# Patient Record
Sex: Male | Born: 1965 | Hispanic: No | Marital: Married | Smoking: Current some day smoker
Health system: Southern US, Community
[De-identification: ages and names within clinical notes are randomized; demographics above are authoritative.]

---

## 2001-04-11 ENCOUNTER — Emergency Department (HOSPITAL_COMMUNITY): Admission: EM | Admit: 2001-04-11 | Discharge: 2001-04-11 | Payer: Self-pay | Admitting: Emergency Medicine

## 2012-06-17 ENCOUNTER — Ambulatory Visit (INDEPENDENT_AMBULATORY_CARE_PROVIDER_SITE_OTHER): Payer: BC Managed Care – PPO | Admitting: Emergency Medicine

## 2012-06-17 VITALS — BP 132/82 | HR 68 | Temp 98.6°F | Resp 17 | Ht 65.5 in | Wt 110.0 lb

## 2012-06-17 DIAGNOSIS — IMO0002 Reserved for concepts with insufficient information to code with codable children: Secondary | ICD-10-CM

## 2012-06-17 DIAGNOSIS — S46919A Strain of unspecified muscle, fascia and tendon at shoulder and upper arm level, unspecified arm, initial encounter: Secondary | ICD-10-CM

## 2012-06-17 MED ORDER — CELECOXIB 200 MG PO CAPS: 200.0000 mg | ORAL_CAPSULE | Freq: Two times a day (BID) | ORAL | Status: AC

## 2012-06-17 MED ORDER — CYCLOBENZAPRINE HCL 10 MG PO TABS: 10.0000 mg | ORAL_TABLET | Freq: Three times a day (TID) | ORAL | Status: AC | PRN

## 2012-06-17 NOTE — Progress Notes (Signed)
   Date:  06/17/2012   Name:  Antonio Stewart   DOB:  [birth year]   MRN:  [id number]  PCP:  No primary provider on file.    Chief Complaint: Shoulder Pain   History of Present Illness:  Antonio Stewart is a 46 y.o. very pleasant male patient who presents with the following:  Pain in right shoulder after starting his pull start lawnmower.  Has marked pain in top of shoulder.  Not involving the joint but in front of scapula.  No numbness tingling or weakness in arm.  Denies other injury or prior injury to shoulder  There is no problem list on file for this patient.  No past medical history on file. No past surgical history on file. History  Substance Use Topics  . Smoking status: Current Everyday Smoker -- 0.5 packs/day for 20 years    Types: Cigarettes  . Smokeless tobacco: Not on file  . Alcohol Use: Not on file   No family history on file. No Known Allergies  Medication list has been reviewed and updated.  No current outpatient prescriptions on file prior to visit.    Review of Systems:  As per HPI, otherwise negative.     Physical Examination: Filed Vitals:   06/17/12 1357  BP: 132/82  Pulse: 68  Temp: 98.6 F (37 C)  Resp: 17   Filed Vitals:   06/17/12 1357  Height: 5' 5.5" (1.664 m)  Weight: 110 lb (49.896 kg)   Body mass index is 18.03 kg/(m^2). Ideal Body Weight: Weight in (lb) to have BMI = 25: 152.2    GEN: WDWN, NAD, Non-toxic, Alert & Oriented x 3 HEENT: Atraumatic, Normocephalic.  Ears and Nose: No external deformity. EXTR: No clubbing/cyanosis/edema NEURO: Normal gait.  PSYCH: Normally interactive. Conversant. Not depressed or anxious appearing.  Calm demeanor.  Right shoulder normal active and passive ROM.  NATI.  EKG / Labs / Xrays: None available at time of encounter  Assessment and Plan: Shoulder strain Ice celebrex Flexeril RTC prn  Carmelina Dane, MD

## 2012-06-17 NOTE — Patient Instructions (Signed)
N?NN: Ch?m [place] Ch?n Th??ng ??nh K?  (RICE - Routine Care for Injuries) Ch?m [place] thng th??ng cho cc t?n th??ng bao g?m ngh? ng?i (Ngh? ng?i), ch??m n??c ? (?), b?ng p (Nn), v k cao chi (Nng), g?i t?t l N?NN. H??NG D?N CH?M [place] T?I NH   C?n thi?t ph?i ngh? ng?i ?? cho c? th? lnh b?nh. Ni chung sau khi b? s?ng v b?m do ch?n th??ng, c th? quay tr? l?i cc ho?t ??ng thng th??ng khi th?y d? ch?u. T?n th??ng gn (gn l c?u trc gi?ng nh? s?i g?n ch?t c? [place] x??ng) v x??ng c?n kho?ng su tu?n ?? lnh b?nh. Dy ch?ng l nh?ng c?u trc gi?ng nh? dy g?n c? v?i x??ng.   Ch??m ? sau khi b? t?n th??ng gip lm gi?m s?ng ph v gi?m ?au.   Cho ? bo [place] ti nh?a.   ??t kh?n t?m gi?a da v ti.   Ch??m ? l?nh trong 15 ??n 20 pht, 3 ??n 4 l?n m?i ngy. Lm ?i?u ny trong khi t?nh, 24 ??n 46 gi? ??u tin. Sau ?, ti?p t?c theo ch? d?n c?a chuyn gia ch?m [place] y t?.   B?ng p s? lm gi?m s?ng, ch?ng ?? cho v? tr ?au v gip ch khi b? ?au. N?u s? d?ng b?ng p (lo?i b?ng qu?n ?n h?i, co gin) [place] ngy ny, nn tho ra v b?ng l?i m?i 3 ??n 4 gi?Imagene Sheller nn b?ng qu ch?t, ch? nn b?ng p v?a ?? ?? gi?m s?ng. Quan st xem ngn tay hay ngn chn c b? s?ng ph, ??i mu da h?i xanh, l?nh, t hay ?au qu m?c khng. N?u xu?t hi?n cc tri?u ch?ng ny, hy tho g? b?ng p ra v b?ng tr? l?i l?ng h?n. N?u cc tri?u ch?ng trn v?n cn, hy h?i Bc s? hay quay tr? l?i n?i khm b?nh.   K cao chi gip gi?m s?ng ph, v gi?m ?au. Khi b? t?n th??ng ? t? chi (cnh tay/bn tay v chn/bn chn), n?u c th? ???c nn k cao vng b? t?n th??ng ngang b?ng hay cao h?n m?c tim.  HY NGAY L?P T?C THAM V?N V?I CHUYN GIA Y T? N?U:   B?n b? ?au dai d?ng v s?ng.   B?n b? t?y ??, t ho?c y?u b?t ng?.   Tri?u ch?ng c?a b?n tr? nn nghim tr?ng h?n thay v c?i thi?n sau vi ngy.  Nh?ng tri?u ch?ng ny c th? cho th?y c?n ti?p t?c ?nh gi thm ho?c c?n ch?p thm X-quang. ?i khi, X-quang khng th? hi?n th? ch? gy  x??ng nh? cho ??n 1 tu?n ho?c 10 ngy sau. B? tr m?t cu?c h?n khm l?i v?i chuyn gia ch?m [place] y t? c?a b?n. Hy h?i khi no s? c k?t qu? X-quang. ??m b?o b?n nh?n ???c k?t qu? X-quang c?a mnh.  Document Released: 11/24/2005 Document Revised: 11/13/2011 [facility] Patient Information 2012 [place], [place].

## 2012-09-06 ENCOUNTER — Ambulatory Visit: Payer: BC Managed Care – PPO

## 2012-09-06 ENCOUNTER — Ambulatory Visit (INDEPENDENT_AMBULATORY_CARE_PROVIDER_SITE_OTHER): Payer: BC Managed Care – PPO | Admitting: Family Medicine

## 2012-09-06 VITALS — BP 112/86 | HR 72 | Temp 98.0°F | Resp 16 | Ht 65.5 in | Wt 111.0 lb

## 2012-09-06 DIAGNOSIS — M549 Dorsalgia, unspecified: Secondary | ICD-10-CM

## 2012-09-06 MED ORDER — TRAMADOL HCL 50 MG PO TABS: 50.0000 mg | ORAL_TABLET | Freq: Three times a day (TID) | ORAL | Status: DC | PRN

## 2012-09-06 NOTE — Progress Notes (Signed)
[facility] [address], [place] [address] [phone]  Date:  09/06/2012   Name:  Antonio Stewart   DOB:  01-07-1966   MRN:  [id number]  PCP:  No primary provider on file.    Chief Complaint: Back Pain   History of Present Illness:  Antonio Stewart is a 46 y.o. very pleasant male patient who presents with the following:  He is here with back pain today.  He is not aware of any injury or inciting movement/did not life anything unusual.  He noted onset of the pain yesterday. No pain into his legs. No numbness or weakness of his legs.    He is otherwise generally healthy.  He has tried some celebrex, but it did not help.   He works at a [employer] and thinks he will need a couple of days off of work- he has to lift a lot at his job There is no problem list on file for this patient.   No past medical history on file.  No past surgical history on file.  History  Substance Use Topics  . Smoking status: Current Every Day Smoker -- 0.5 packs/day for 20 years    Types: Cigarettes  . Smokeless tobacco: Not on file  . Alcohol Use: Not on file    No family history on file.  No Known Allergies  Medication list has been reviewed and updated.  No current outpatient prescriptions on file prior to visit.    Review of Systems:  As per HPI- otherwise negative.   Physical Examination: Filed Vitals:   09/06/12 1247  BP: 112/86  Pulse: 72  Temp: 98 F (36.7 C)  Resp: 16   Filed Vitals:   09/06/12 1247  Height: 5' 5.5" (1.664 m)  Weight: 111 lb (50.349 kg)   Body mass index is 18.19 kg/(m^2). Ideal Body Weight: Weight in (lb) to have BMI = 25: 152.2   GEN: WDWN, NAD, Non-toxic, A & O x 3, thin build HEENT: Atraumatic, Normocephalic. Neck supple. No masses, No LAD. Ears and Nose: No external deformity. CV: RRR, No M/G/R. No JVD. No thrill. No extra heart sounds. PULM: CTA B, no wheezes, crackles, rhonchi. No retractions. No resp. distress. No  accessory muscle use. EXTR: No c/c/e NEURO Normal gait.  PSYCH: Normally interactive. Conversant. Not depressed or anxious appearing.  Calm demeanor.  He has tenderness over his lumbar spine- over the bone and right sided muscles.  Normal flexion, normal extension, normal LE strength and sensation, normal patellar DTR.  [facility] reading (PRIMARY) by  Dr. Patsy Lager.  Lumbar spine: minimal spurring, otherwise negative  LUMBAR SPINE - 2-3 VIEW  Comparison: None.  Findings: Small anterior endplate spurs at G9-F6. Negative for fracture. Vertebral body and intervertebral disc height appears well maintained throughout. Normal alignment and mineralization.  IMPRESSION:  1. Small anterior endplate spurs. No acute abnormality  Assessment and Plan: 1. Pain in back  DG Lumbar Spine 2-3 Views, traMADol (ULTRAM) 50 MG tablet   Back strain without any indication of anything more serious. Let me know if not better in the next 2 or 3 days.  Tramadol as needed for back pain  Arbie Reisz, MD

## 2012-09-10 ENCOUNTER — Telehealth: Payer: Self-pay

## 2012-09-10 DIAGNOSIS — M549 Dorsalgia, unspecified: Secondary | ICD-10-CM

## 2012-09-10 NOTE — Telephone Encounter (Signed)
We rx'd Ultram , been taking since the end of September but it makes him feel dizzy and sick to his stomach. [facility] on [address]  [phone]   Name is [facility]

## 2012-09-12 NOTE — Telephone Encounter (Signed)
If pain is improved stop Ultram.  Or he can take OTC ibuprofen or tylenol if needed or follow up if worse

## 2012-09-12 NOTE — Telephone Encounter (Signed)
Spoke with daughter and he states the medication is making him vomit. I advised to take OTC medications for pain but daughter states he is in a lot of pain and would like something else called. Please advise

## 2012-09-13 NOTE — Telephone Encounter (Signed)
Called and LMOM with olee- no answer.  If ultram is causing vomiting and dizziness, vicodin may be worse but we can give it a try.  However, I want to talk with them regarding his symptoms with the ultram first to be sure not an allergic reaction.  I will call tomorrow

## 2012-09-14 MED ORDER — CYCLOBENZAPRINE HCL 10 MG PO TABS: 10.0000 mg | ORAL_TABLET | Freq: Two times a day (BID) | ORAL | Status: DC | PRN

## 2012-09-14 NOTE — Telephone Encounter (Signed)
Called to talked to his daughter in law.  We will try some flexeril as the ultram makes him feel nauseated.  Cautioned that he is to use ultram OR flexeril, not both.  Flexeril is to be used at night because it also can make him sleepy.  They will let me know if this is not helpful

## 2013-04-02 IMAGING — CR DG LUMBAR SPINE 2-3V
2 series · 2 of 2 positions shown · non-contrast
Comparison: None.

CLINICAL DATA: Low back pain without trauma

LUMBAR SPINE - 2-3 VIEW

[AP]
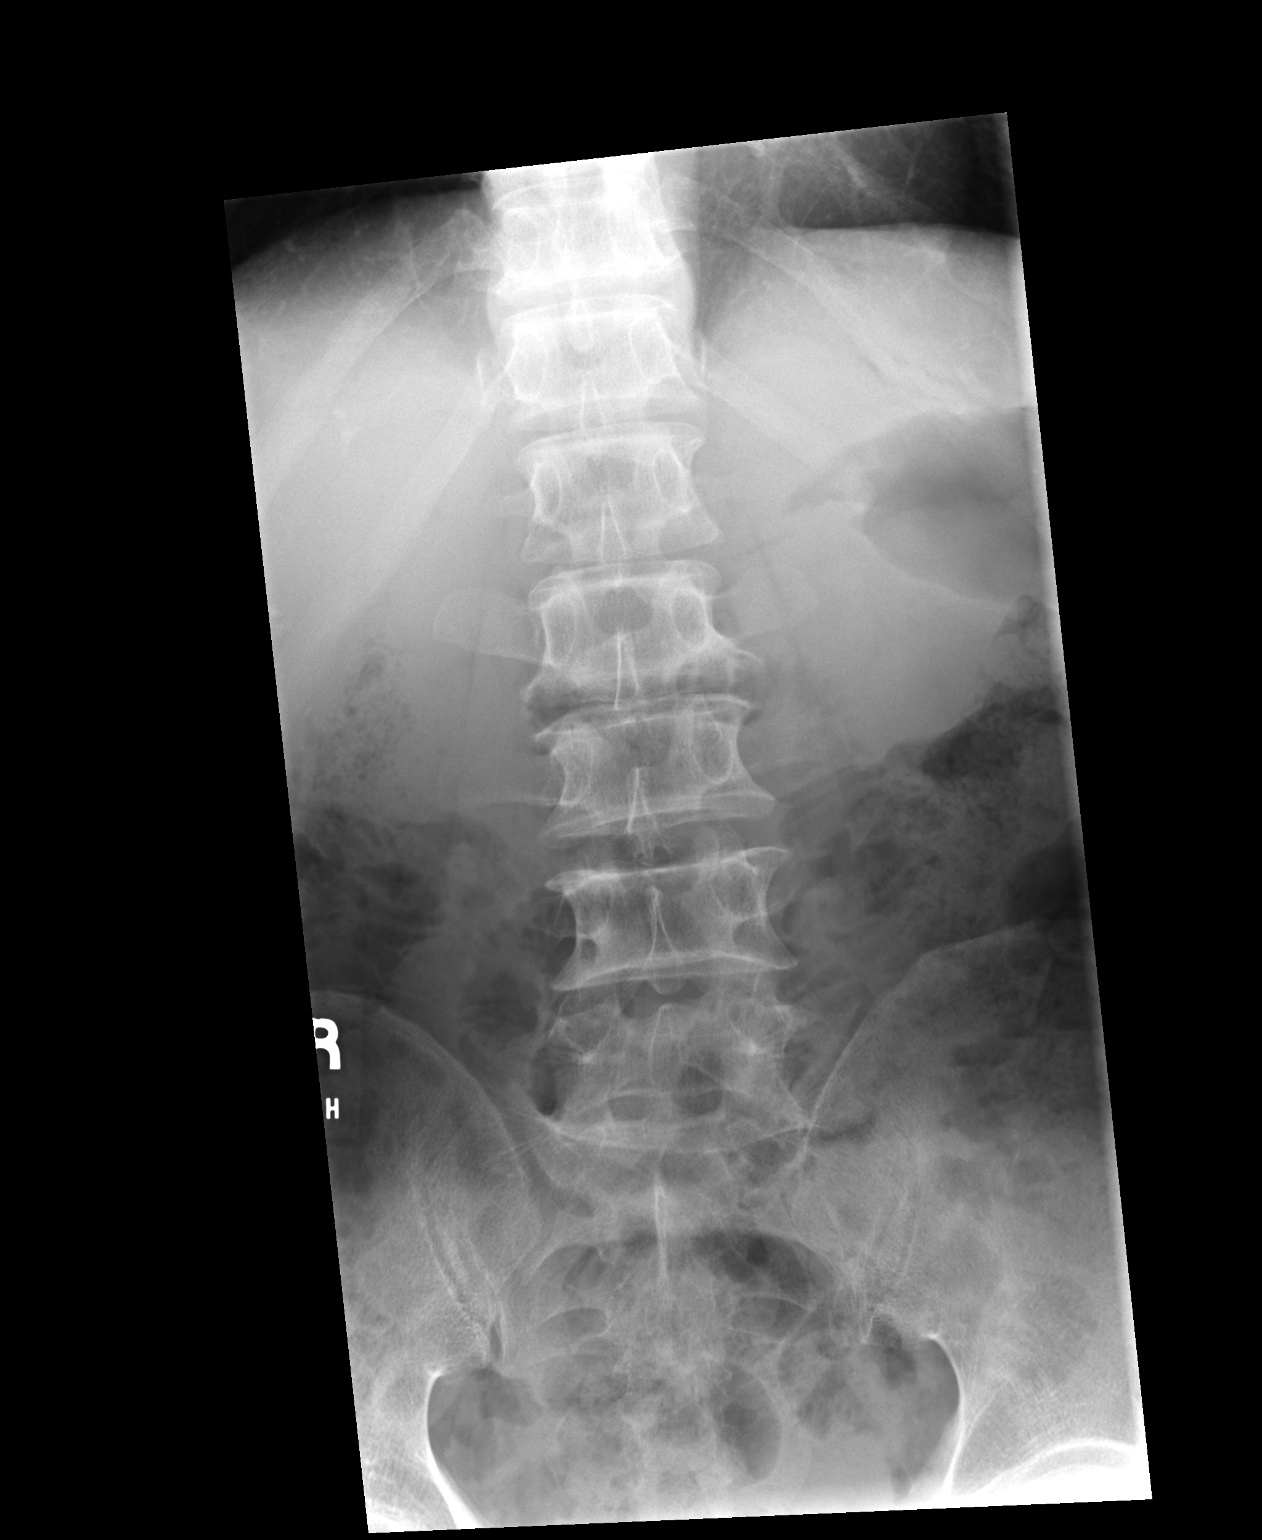

[lateral]
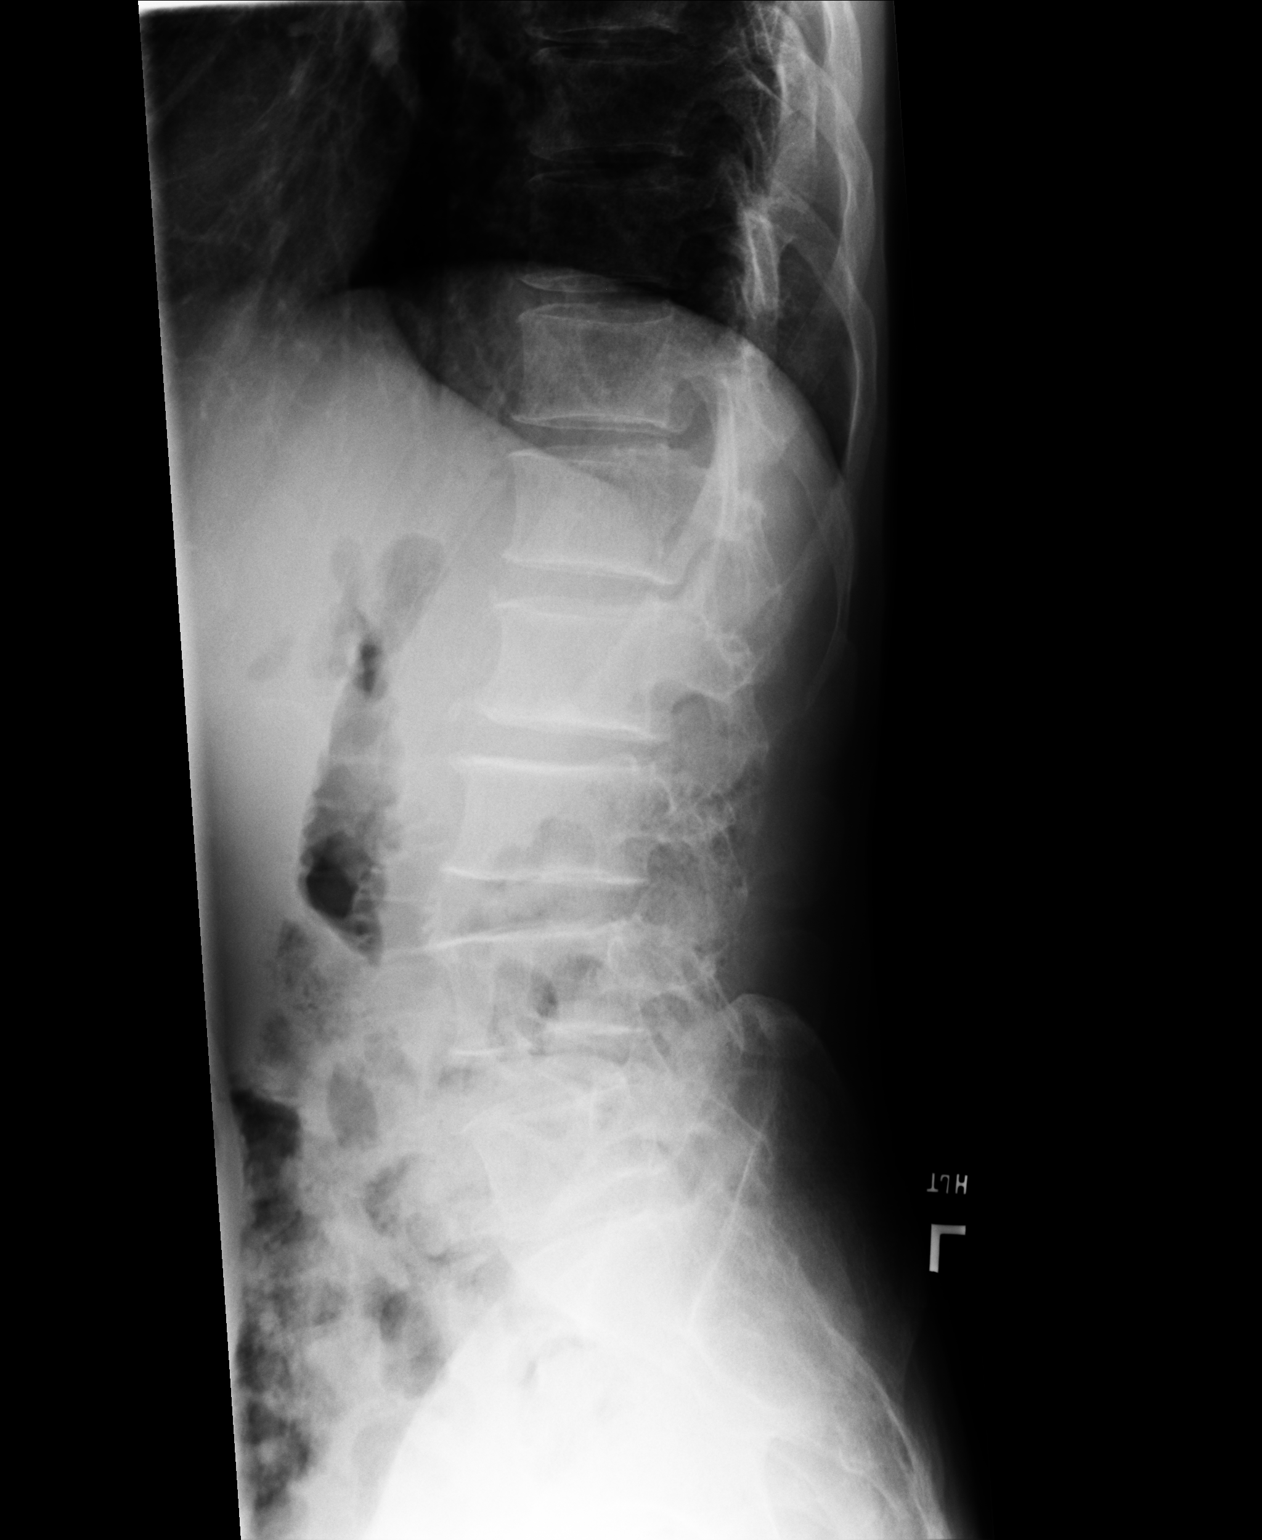

[2 of 2 positions shown; findings below may reference images not displayed]

FINDINGS: Small anterior endplate spurs at L1-L5.  Negative for
fracture.  Vertebral body and intervertebral disc height appears
well maintained throughout.  Normal alignment and mineralization.
IMPRESSION: 1.  Small anterior endplate spurs.  No acute abnormality.

## 2014-10-14 ENCOUNTER — Ambulatory Visit (INDEPENDENT_AMBULATORY_CARE_PROVIDER_SITE_OTHER): Payer: BC Managed Care – PPO | Admitting: Family Medicine

## 2014-10-14 VITALS — BP 122/84 | HR 64 | Temp 98.2°F | Resp 16 | Ht 65.0 in | Wt 108.4 lb

## 2014-10-14 DIAGNOSIS — Z125 Encounter for screening for malignant neoplasm of prostate: Secondary | ICD-10-CM

## 2014-10-14 DIAGNOSIS — Z13220 Encounter for screening for lipoid disorders: Secondary | ICD-10-CM

## 2014-10-14 DIAGNOSIS — R319 Hematuria, unspecified: Secondary | ICD-10-CM

## 2014-10-14 DIAGNOSIS — Z0000 Encounter for general adult medical examination without abnormal findings: Secondary | ICD-10-CM

## 2014-10-14 DIAGNOSIS — R350 Frequency of micturition: Secondary | ICD-10-CM

## 2014-10-14 DIAGNOSIS — Z131 Encounter for screening for diabetes mellitus: Secondary | ICD-10-CM

## 2014-10-14 DIAGNOSIS — Z23 Encounter for immunization: Secondary | ICD-10-CM

## 2014-10-14 DIAGNOSIS — Z1211 Encounter for screening for malignant neoplasm of colon: Secondary | ICD-10-CM

## 2014-10-14 LAB — COMPLETE METABOLIC PANEL WITH GFR
ALBUMIN: 4.4 g/dL (ref 3.5–5.2)
ALT: 33 U/L (ref 0–53)
AST: 31 U/L (ref 0–37)
Alkaline Phosphatase: 67 U/L (ref 39–117)
BUN: 14 mg/dL (ref 6–23)
CALCIUM: 9.4 mg/dL (ref 8.4–10.5)
CHLORIDE: 101 meq/L (ref 96–112)
CO2: 29 meq/L (ref 19–32)
CREATININE: 0.68 mg/dL (ref 0.50–1.35)
GFR, Est Non African American: >89 mL/min
Glucose, Bld: 90 mg/dL (ref 70–99)
POTASSIUM: 4 meq/L (ref 3.5–5.3)
Sodium: 139 mEq/L (ref 135–145)
Total Bilirubin: 0.6 mg/dL (ref 0.2–1.2)
Total Protein: 7.6 g/dL (ref 6.0–8.3)

## 2014-10-14 LAB — POCT UA - MICROSCOPIC ONLY
BACTERIA, U MICROSCOPIC: NEGATIVE
CRYSTALS, UR, HPF, POC: NEGATIVE
Casts, Ur, LPF, POC: NEGATIVE
Epithelial cells, urine per micros: NEGATIVE
Mucus, UA: NEGATIVE
WBC, UR, HPF, POC: NEGATIVE
Yeast, UA: NEGATIVE

## 2014-10-14 LAB — LIPID PANEL
Cholesterol: 205 mg/dL — ABNORMAL HIGH (ref 0–200)
HDL: 48 mg/dL (ref >39)
LDL CALC: 142 mg/dL — AB (ref 0–99)
TRIGLYCERIDES: 75 mg/dL (ref <150)
Total CHOL/HDL Ratio: 4.3 Ratio
VLDL: 15 mg/dL (ref 0–40)

## 2014-10-14 LAB — POCT URINALYSIS DIPSTICK
BILIRUBIN UA: NEGATIVE
GLUCOSE UA: NEGATIVE
KETONES UA: NEGATIVE
LEUKOCYTES UA: NEGATIVE
Nitrite, UA: NEGATIVE
PROTEIN UA: NEGATIVE
SPEC GRAV UA: 1.02
Urobilinogen, UA: 0.2
pH, UA: 7.5

## 2014-10-14 NOTE — Progress Notes (Addendum)
This chart was scribed for Meredith Staggers, MD by Luisa Dago, ED Scribe. This patient was seen in room 3 and the patient's care was started at 1:16 PM.  Subjective:    Patient ID: Antonio Stewart, male    DOB: Oct 28, 1966, 48 y.o.   MRN: [id number]  Chief Complaint  Patient presents with  . Annual Exam    HPI Antonio Stewart is a 48 y.o. male with no active medical problems.  Pt is here in the office today for a complete physical exam. He states that he is feeling well overall. However, he sometimes experiences intermittent back pain. Pt denies taking any daily medication.  Immunization: None listed in chart. He denies getting a flu vaccine this season. However, he agrees to having one done today. Pt states that his tetanus injection is out of date, he also agrees to getting that updated today.  Prostate Cancer screening: pt is willing to get screened for prostate cancer today.  Colonoscopy: Pt has not had a colonoscopy done in the past. He agrees to being referred to a specialist for it.   Dentist: pt does not have a dentist. Advised him to follow up with a dentist every 6 months. Pt states that he will follow up on that.  Exercise: pt does not have a regular exercise regiment. However, he states that his job keeps him pretty active.    There are no active problems to display for this patient.  No past medical history on file. No past surgical history on file. No Known Allergies Prior to Admission medications   Medication Sig Start Date End Date Taking? Authorizing Provider  cyclobenzaprine (FLEXERIL) 10 MG tablet Take 1 tablet (10 mg total) by mouth 2 (two) times daily as needed for muscle spasms. 09/14/12   Gwenlyn Found Copland, MD  traMADol (ULTRAM) 50 MG tablet Take 1 tablet (50 mg total) by mouth every 8 (eight) hours as needed for pain. 09/06/12   Pearline Cables, MD   History   Social History  . Marital Status: Married    Spouse Name: N/A    Number of Children: N/A  . Years of  Education: N/A   Occupational History  . Not on file.   Social History Main Topics  . Smoking status: Former Smoker -- 0.50 packs/day for 20 years    Types: Cigarettes  . Smokeless tobacco: Not on file  . Alcohol Use: Not on file  . Drug Use: Not on file  . Sexual Activity: Not on file   Other Topics Concern  . Not on file   Social History Narrative    Review of Systems  Genitourinary: Positive for frequency (few weeks ago - not now. ).  Musculoskeletal: Positive for back pain (in past - not now. ).  All other systems reviewed and are negative. 13.ROS per pt ROS no positive responses   Objective:   Physical Exam  Constitutional: He is oriented to person, place, and time. He appears well-developed and well-nourished.  Thin build but no acute distress.   HENT:  Head: Normocephalic and atraumatic.  Right Ear: External ear normal.  Left Ear: External ear normal.  Mouth/Throat: Oropharynx is clear and moist.  Eyes: Conjunctivae and EOM are normal. Pupils are equal, round, and reactive to light.  Neck: Normal range of motion. Neck supple. No thyromegaly present.  Cardiovascular: Normal rate, regular rhythm, normal heart sounds and intact distal pulses.   Pulmonary/Chest: Effort normal and breath sounds normal. No respiratory  distress. He has no wheezes.  Abdominal: Soft. He exhibits no distension. There is no tenderness. Hernia confirmed negative in the right inguinal area and confirmed negative in the left inguinal area.  Genitourinary: Prostate normal.  Musculoskeletal: Normal range of motion. He exhibits no edema or tenderness.  Lymphadenopathy:    He has no cervical adenopathy.  Neurological: He is alert and oriented to person, place, and time. He has normal reflexes.  Skin: Skin is warm and dry.  Psychiatric: He has a normal mood and affect. His behavior is normal.  Nursing note and vitals reviewed.   Filed Vitals:   10/14/14 1251  BP: 122/84  Pulse: 64  Temp: 98.2  F (36.8 C)  Resp: 16  Height: 5\' 5"  (1.651 m)  Weight: 108 lb 6.4 oz (49.17 kg)  SpO2: 99%          Assessment & Plan:   Antonio Stewart is a 48 y.o. male Annual physical exam - Plan: COMPLETE METABOLIC PANEL WITH GFR, Lipid panel  --anticipatory guidance as below in AVS, screening labs above. Health maintenance items as above in HPI discussed/recommended as applicable.   Screening for hyperlipidemia - Plan: Lipid panel  Screening for diabetes mellitus - Plan: COMPLETE METABOLIC PANEL WITH GFR  Screening for prostate cancer - Plan: PSA  Urinary frequency - Plan: PSA, POCT UA - Microscopic Only, POCT urinalysis dipstick  - now resolved, but trace blood on U/a.  Check PSA, urine cx, and repeat U/a in 4-6 weeks.   Need for prophylactic vaccination and inoculation against influenza - Plan: Flu Vaccine QUAD 36+ mos IM  Need for Tdap vaccination - Plan: Tdap vaccine greater than or equal to 7yo IM  Screen for colon cancer - Plan: Ambulatory referral to Gastroenterology after age 50.    No orders of the defined types were placed in this encounter.   Patient Instructions  You should receive a call or letter about your lab results within the next week to 10 days.  If back pain or urine symptoms return - recheck in office.  I will refer you to gastroenterologist to schedule screening for colon cancer.  See dentist as discussed.  Return to the clinic or go to the nearest emergency room if any of your symptoms worsen or new symptoms occur.  Keeping you healthy - recommendations starting at 50 years old:  Get these tests  Blood pressure- Have your blood pressure checked once a year by your healthcare provider.  Normal blood pressure is 120/80  Weight- Have your body mass index (BMI) calculated to screen for obesity.  BMI is a measure of body fat based on height and weight. You can also calculate your own BMI at [email or web address]  Cholesterol- Have your cholesterol checked  every year.  Diabetes- Have your blood sugar checked regularly if you have high blood pressure, high cholesterol, have a family history of diabetes or if you are overweight.  Screening for Colon Cancer- Colonoscopy starting at age 50.  Screening may begin sooner depending on your family history and other health conditions. Follow up colonoscopy as directed by your Gastroenterologist.  Screening for Prostate Cancer- Both blood work (PSA) and a rectal exam help screen for Prostate Cancer.  Screening begins at age 50 with African-American men and at age 50 with Caucasian men.  Screening may begin sooner depending on your family history.  Take these medicines  Aspirin- One aspirin daily can help prevent Heart disease and Stroke.  Flu shot- Every fall.  Tetanus- Every 10 years.  Zostavax- Once after the age of 50 to prevent Shingles.  Pneumonia shot- Once after the age of 50; if you are younger than 1465, ask your healthcare provider if you need a Pneumonia shot.  Take these steps  Don't smoke- If you do smoke, talk to your doctor about quitting.  For tips on how to quit, go to www.smokefree.gov or call 1-800-QUIT-NOW.  Be physically active- Exercise 5 days a week for at least 30 minutes.  If you are not already physically active start slow and gradually work up to 30 minutes of moderate physical activity.  Examples of moderate activity include walking briskly, mowing the yard, dancing, swimming, bicycling, etc.  Eat a healthy diet- Eat a variety of healthy food such as fruits, vegetables, low fat milk, low fat cheese, yogurt, lean meant, poultry, fish, beans, tofu, etc. For more information go to www.thenutritionsource.org  Drink alcohol in moderation- Limit alcohol intake to less than two drinks a day. Never drink and drive.  Dentist- Brush and floss twice daily; visit your dentist twice a year.  Depression- Your emotional health is as important as your physical health. If you're feeling  down, or losing interest in things you would normally enjoy please talk to your healthcare provider.  Eye exam- Visit your eye doctor every year.  Safe sex- If you may be exposed to a sexually transmitted infection, use a condom.  Seat belts- Seat belts can save your life; always wear one.  Smoke/Carbon Monoxide detectors- These detectors need to be installed on the appropriate level of your home.  Replace batteries at least once a year.  Skin cancer- When out in the sun, cover up and use sunscreen 15 SPF or higher.  Violence- If anyone is threatening you, please tell your healthcare provider.  Living Will/ Health care power of attorney- Speak with your healthcare provider and family.  I personally performed the services described in this documentation, which was scribed in my presence. The recorded information has been reviewed and considered, and addended by me as needed.

## 2014-10-14 NOTE — Patient Instructions (Addendum)
You should receive a call or letter about your lab results within the next week to 10 days.  Recheck urine test in next 6 weeks (blood in urine today).  If back pain or urine symptoms return - recheck in office.  I will refer you to gastroenterologist to schedule screening for colon cancer.  See dentist as discussed.  Return to the clinic or go to the nearest emergency room if any of your symptoms worsen or new symptoms occur.  Keeping you healthy - recommendations starting at 50 years old:  Get these tests  Blood pressure- Have your blood pressure checked once a year by your healthcare provider.  Normal blood pressure is 120/80  Weight- Have your body mass index (BMI) calculated to screen for obesity.  BMI is a measure of body fat based on height and weight. You can also calculate your own BMI at [email or web address]  Cholesterol- Have your cholesterol checked every year.  Diabetes- Have your blood sugar checked regularly if you have high blood pressure, high cholesterol, have a family history of diabetes or if you are overweight.  Screening for Colon Cancer- Colonoscopy starting at age 50.  Screening may begin sooner depending on your family history and other health conditions. Follow up colonoscopy as directed by your Gastroenterologist.  Screening for Prostate Cancer- Both blood work (PSA) and a rectal exam help screen for Prostate Cancer.  Screening begins at age 50 with African-American men and at age 50 with Caucasian men.  Screening may begin sooner depending on your family history.  Take these medicines  Aspirin- One aspirin daily can help prevent Heart disease and Stroke.  Flu shot- Every fall.  Tetanus- Every 10 years.  Zostavax- Once after the age of 50 to prevent Shingles.  Pneumonia shot- Once after the age of 50; if you are younger than 50, ask your healthcare provider if you need a Pneumonia shot.  Take these steps  Don't smoke- If you do smoke, talk to your  doctor about quitting.  For tips on how to quit, go to www.smokefree.gov or call 1-800-QUIT-NOW.  Be physically active- Exercise 5 days a week for at least 30 minutes.  If you are not already physically active start slow and gradually work up to 30 minutes of moderate physical activity.  Examples of moderate activity include walking briskly, mowing the yard, dancing, swimming, bicycling, etc.  Eat a healthy diet- Eat a variety of healthy food such as fruits, vegetables, low fat milk, low fat cheese, yogurt, lean meant, poultry, fish, beans, tofu, etc. For more information go to www.thenutritionsource.org  Drink alcohol in moderation- Limit alcohol intake to less than two drinks a day. Never drink and drive.  Dentist- Brush and floss twice daily; visit your dentist twice a year.  Depression- Your emotional health is as important as your physical health. If you're feeling down, or losing interest in things you would normally enjoy please talk to your healthcare provider.  Eye exam- Visit your eye doctor every year.  Safe sex- If you may be exposed to a sexually transmitted infection, use a condom.  Seat belts- Seat belts can save your life; always wear one.  Smoke/Carbon Monoxide detectors- These detectors need to be installed on the appropriate level of your home.  Replace batteries at least once a year.  Skin cancer- When out in the sun, cover up and use sunscreen 15 SPF or higher.  Violence- If anyone is threatening you, please tell your healthcare provider.  Living Will/ Health  care power of attorney- Speak with your healthcare provider and family.  Hematuria Hematuria is blood in your urine. It can be caused by a bladder infection, kidney infection, prostate infection, kidney stone, or cancer of your urinary tract. Infections can usually be treated with medicine, and a kidney stone usually will pass through your urine. If neither of these is the cause of your hematuria, further workup  to find out the reason may be needed. It is very important that you tell your health care provider about any blood you see in your urine, even if the blood stops without treatment or happens without causing pain. Blood in your urine that happens and then stops and then happens again can be a symptom of a very serious condition. Also, pain is not a symptom in the initial stages of many urinary cancers. HOME CARE INSTRUCTIONS   Drink lots of fluid, 3-4 quarts a day. If you have been diagnosed with an infection, cranberry juice is especially recommended, in addition to large amounts of water.  Avoid caffeine, tea, and carbonated beverages because they tend to irritate the bladder.  Avoid alcohol because it may irritate the prostate.  Take all medicines as directed by your health care provider.  If you were prescribed an antibiotic medicine, finish it all even if you start to feel better.  If you have been diagnosed with a kidney stone, follow your health care provider's instructions regarding straining your urine to catch the stone.  Empty your bladder often. Avoid holding urine for long periods of time.  After a bowel movement, women should cleanse front to back. Use each tissue only once.  Empty your bladder before and after sexual intercourse if you are a male. SEEK MEDICAL CARE IF:  You develop back pain.  You have a fever.  You have a feeling of sickness in your stomach (nausea) or vomiting.  Your symptoms are not better in 3 days. Return sooner if you are getting worse. SEEK IMMEDIATE MEDICAL CARE IF:   You develop severe vomiting and are unable to keep the medicine down.  You develop severe back or abdominal pain despite taking your medicines.  You begin passing a large amount of blood or clots in your urine.  You feel extremely weak or faint, or you pass out. MAKE SURE YOU:   Understand these instructions.  Will watch your condition.  Will get help right away if  you are not doing well or get worse. Document Released: 11/24/2005 Document Revised: 04/10/2014 Document Reviewed: 07/25/2013 [facility] Patient Information 2015 [place], [place]. This information is not intended to replace advice given to you by your health care provider. Make sure you discuss any questions you have with your health care provider.

## 2014-10-16 LAB — PSA: PSA: 0.92 ng/mL

## 2014-10-16 LAB — URINE CULTURE: Colony Count: 5000

## 2014-10-19 ENCOUNTER — Telehealth: Payer: Self-pay | Admitting: Family Medicine

## 2014-10-19 NOTE — Telephone Encounter (Signed)
Patient daughter called for patient due to language barrier. He is requesting medication. Daughter will discuss with him to get details. She will call back and let us know

## 2014-10-19 NOTE — Telephone Encounter (Signed)
error 

## 2014-10-23 NOTE — Telephone Encounter (Signed)
Pt's daughter called. Wanted to know if you could RF his Flexeril and Tramadol for muscle spasms or if he would have to RTC. Please advise. Thanks   Daughter Martie LeeSabrina: [phone]

## 2014-10-23 NOTE — Telephone Encounter (Signed)
Recommend he rtc to discuss further, as not discussed in detail last ov and it appears he was prescribed these medicines in 2013.

## 2014-10-24 NOTE — Telephone Encounter (Signed)
Spoke to daughter advised pt would need to RTC.

## 2014-12-15 ENCOUNTER — Encounter: Payer: Self-pay | Admitting: Family Medicine

## 2016-11-11 ENCOUNTER — Ambulatory Visit (INDEPENDENT_AMBULATORY_CARE_PROVIDER_SITE_OTHER): Payer: BLUE CROSS/BLUE SHIELD | Admitting: Physician Assistant

## 2016-11-11 VITALS — BP 114/68 | HR 62 | Temp 98.5°F | Resp 17 | Ht 62.5 in | Wt 111.0 lb

## 2016-11-11 DIAGNOSIS — M5126 Other intervertebral disc displacement, lumbar region: Secondary | ICD-10-CM | POA: Diagnosis not present

## 2016-11-11 DIAGNOSIS — M545 Low back pain, unspecified: Secondary | ICD-10-CM

## 2016-11-11 MED ORDER — CELECOXIB 200 MG PO CAPS: 200.0000 mg | ORAL_CAPSULE | Freq: Two times a day (BID) | ORAL | 1 refills | Status: DC

## 2016-11-11 NOTE — Progress Notes (Signed)
   Antonio Stewart  MRN: [id number] DOB: 07-17-1966  PCP: No primary care provider on file.  Subjective:  Pt is a 50 year old male who presents to clinic for back pain. He speaks [place]namese, video interpreter used today, [id number]. Started yesterday in his low back. His pain is located on his low back in the middle, does not radiate. He was building a bed frame while at work and was lifting the frame from from a bended position at his side. He felt pain at his low back, both sides. Has not tried ice, heat, medications.  Denies saddle paresthesia, loss of bowel or bladder control, numbness/tingling of extremities, weakness.  Reports occasional back pain. Does not take anything for it. Has taken Celebrex in the past, states it worked well for him.    Review of Systems  Constitutional: Negative for chills and diaphoresis.  Respiratory: Negative for cough, chest tightness, shortness of breath and wheezing.   Cardiovascular: Negative for chest pain, palpitations and leg swelling.  Gastrointestinal: Negative for constipation, diarrhea, nausea and vomiting.  Musculoskeletal: Positive for back pain. Negative for neck pain.  Neurological: Negative for dizziness, syncope, light-headedness and headaches.  Psychiatric/Behavioral: Negative for sleep disturbance.    There are no active problems to display for this patient.   Current Outpatient Prescriptions on File Prior to Visit  Medication Sig Dispense Refill  . cyclobenzaprine (FLEXERIL) 10 MG tablet Take 1 tablet (10 mg total) by mouth 2 (two) times daily as needed for muscle spasms. (Patient not taking: Reported on 11/11/2016) 30 tablet 0  . traMADol (ULTRAM) 50 MG tablet Take 1 tablet (50 mg total) by mouth every 8 (eight) hours as needed for pain. (Patient not taking: Reported on 11/11/2016) 30 tablet 0   No current facility-administered medications on file prior to visit.     No Known Allergies   Objective:  BP 114/68 (BP Location: Right Arm,  Patient Position: Sitting, Cuff Size: Normal)   Pulse 62   Temp 98.5 F (36.9 C) (Oral)   Resp 17   Ht 5' 2.5" (1.588 m)   Wt 111 lb (50.3 kg)   SpO2 98%   BMI 19.98 kg/m   Physical Exam  Constitutional: He is oriented to person, place, and time and well-developed, well-nourished, and in no distress. No distress.  Cardiovascular: Normal rate, regular rhythm and normal heart sounds.   Musculoskeletal:       Lumbar back: He exhibits decreased range of motion (with extension only) and tenderness. He exhibits no bony tenderness, no pain and no spasm.  Neurological: He is alert and oriented to person, place, and time. GCS score is 15.  Skin: Skin is warm and dry.  Psychiatric: Mood, memory, affect and judgment normal.  Vitals reviewed.   Assessment and Plan :  1. Acute midline low back pain without sciatica 2. Lumbar herniated disc - celecoxib (CELEBREX) 200 MG capsule; Take 1 capsule (200 mg total) by mouth 2 (two) times daily.  Dispense: 60 capsule; Refill: 1 - Stretches demonstrated and discussed with patient. He is to stretch, walk and use heat daily. RTC in 3-4 weeks if no improvement. Consider referral to physical therapy if needed.   Marco CollieWhitney Malaika Arnall, PA-C  [facility] 11/11/2016 11:46 AM

## 2016-11-11 NOTE — Patient Instructions (Addendum)
Patient is to perform exercises below at 5 sets with 10 repetitions. Stretches are to be performed for 5 sets, 10 seconds each. Recommended she perform this rehab twice daily within pain tolerance for 2 weeks. Please use heat for 15 - 20 minutes at a time three times a day on your back.  If you are not better in 3-4 weeks, please come back and we will refer you to physical therapy at that time.     Low Back Strain Rehab Ask your health care provider which exercises are safe for you. Do exercises exactly as told by your health care provider and adjust them as directed. It is normal to feel mild stretching, pulling, tightness, or discomfort as you do these exercises, but you should stop right away if you feel sudden pain or your pain gets worse. Do not begin these exercises until told by your health care provider. Stretching and range of motion exercises These exercises warm up your muscles and joints and improve the movement and flexibility of your back. These exercises also help to relieve pain, numbness, and tingling. Exercise A: Single knee to chest 1. Lie on your back on a firm surface with both legs straight. 2. Bend one of your knees. Use your hands to move your knee up toward your chest until you feel a gentle stretch in your lower back and buttock.  Hold your leg in this position by holding onto the front of your knee.  Keep your other leg as straight as possible. 3. Hold for __________ seconds. 4. Slowly return to the starting position. 5. Repeat with your other leg. Repeat __________ times. Complete this exercise __________ times a day. Exercise B: Prone extension on elbows 1. Lie on your abdomen on a firm surface. 2. Prop yourself up on your elbows. 3. Use your arms to help lift your chest up until you feel a gentle stretch in your abdomen and your lower back.  This will place some of your body weight on your elbows. If this is uncomfortable, try stacking pillows under your  chest.  Your hips should stay down, against the surface that you are lying on. Keep your hip and back muscles relaxed. 4. Hold for __________ seconds. 5. Slowly relax your upper body and return to the starting position. Repeat __________ times. Complete this exercise __________ times a day. Strengthening exercises These exercises build strength and endurance in your back. Endurance is the ability to use your muscles for a long time, even after they get tired. Exercise C: Pelvic tilt 1. Lie on your back on a firm surface. Bend your knees and keep your feet flat. 2. Tense your abdominal muscles. Tip your pelvis up toward the ceiling and flatten your lower back into the floor.  To help with this exercise, you may place a small towel under your lower back and try to push your back into the towel. 3. Hold for __________ seconds. 4. Let your muscles relax completely before you repeat this exercise. Repeat __________ times. Complete this exercise __________ times a day. Exercise D: Alternating arm and leg raises 1. Get on your hands and knees on a firm surface. If you are on a hard floor, you may want to use padding to cushion your knees, such as an exercise mat. 2. Line up your arms and legs. Your hands should be below your shoulders, and your knees should be below your hips. 3. Lift your left leg behind you. At the same time, raise your right  arm and straighten it in front of you.  Do not lift your leg higher than your hip.  Do not lift your arm higher than your shoulder.  Keep your abdominal and back muscles tight.  Keep your hips facing the ground.  Do not arch your back.  Keep your balance carefully, and do not hold your breath. 4. Hold for __________ seconds. 5. Slowly return to the starting position and repeat with your right leg and your left arm. Repeat __________ times. Complete this exercise __________times a day. Exercise J: Single leg lower with bent knees 1. Lie on your  back on a firm surface. 2. Tense your abdominal muscles and lift your feet off the floor, one foot at a time, so your knees and hips are bent in an "L" shape (at about 90 degrees).  Your knees should be over your hips and your lower legs should be parallel to the floor. 3. Keeping your abdominal muscles tense and your knee bent, slowly lower one of your legs so your toe touches the ground. 4. Lift your leg back up to return to the starting position.  Do not hold your breath.  Do not let your back arch. Keep your back flat against the ground. 5. Repeat with your other leg. Repeat __________ times. Complete this exercise __________ times a day. Posture and body mechanics   Body mechanics refers to the movements and positions of your body while you do your daily activities. Posture is part of body mechanics. Good posture and healthy body mechanics can help to relieve stress in your body's tissues and joints. Good posture means that your spine is in its natural S-curve position (your spine is neutral), your shoulders are pulled back slightly, and your head is not tipped forward. The following are general guidelines for applying improved posture and body mechanics to your everyday activities. Standing   When standing, keep your spine neutral and your feet about hip-width apart. Keep a slight bend in your knees. Your ears, shoulders, and hips should line up.  When you do a task in which you stand in one place for a long time, place one foot up on a stable object that is 2-4 inches (5-10 cm) high, such as a footstool. This helps keep your spine neutral. Sitting  When sitting, keep your spine neutral and keep your feet flat on the floor. Use a footrest, if necessary, and keep your thighs parallel to the floor. Avoid rounding your shoulders, and avoid tilting your head forward.  When working at a desk or a computer, keep your desk at a height where your hands are slightly lower than your elbows.  Slide your chair under your desk so you are close enough to maintain good posture.  When working at a computer, place your monitor at a height where you are looking straight ahead and you do not have to tilt your head forward or downward to look at the screen. Resting   When lying down and resting, avoid positions that are most painful for you.  If you have pain with activities such as sitting, bending, stooping, or squatting (flexion-based activities), lie in a position in which your body does not bend very much. For example, avoid curling up on your side with your arms and knees near your chest (fetal position).  If you have pain with activities such as standing for a long time or reaching with your arms (extension-based activities), lie with your spine in a neutral position and bend  your knees slightly. Try the following positions:  Lying on your side with a pillow between your knees.  Lying on your back with a pillow under your knees. Lifting   When lifting objects, keep your feet at least shoulder-width apart and tighten your abdominal muscles.  Bend your knees and hips and keep your spine neutral. It is important to lift using the strength of your legs, not your back. Do not lock your knees straight out.  Always ask for help to lift heavy or awkward objects. This information is not intended to replace advice given to you by your health care provider. Make sure you discuss any questions you have with your health care provider. Document Released: 11/24/2005 Document Revised: 07/31/2016 Document Reviewed: 09/05/2015 Elsevier Interactive Patient Education  2017 [employer].   IF you received an x-ray today, you will receive an invoice from [facility]. Please contact [facility] at [phone] with questions or concerns regarding your invoice.   IF you received labwork today, you will receive an invoice from [employer] [facility]. Please contact  [facility] at [phone] with questions or concerns regarding your invoice.   Our billing staff will not be able to assist you with questions regarding bills from these companies.  You will be contacted with the lab results as soon as they are available. The fastest way to get your results is to activate your My Chart account. Instructions are located on the last page of this paperwork. If you have not heard from [place] regarding the results in 2 weeks, please contact this office.

## 2016-11-15 ENCOUNTER — Ambulatory Visit: Payer: BLUE CROSS/BLUE SHIELD

## 2016-11-24 ENCOUNTER — Ambulatory Visit: Payer: BLUE CROSS/BLUE SHIELD

## 2016-11-27 ENCOUNTER — Telehealth: Payer: Self-pay

## 2016-11-27 NOTE — Telephone Encounter (Signed)
Celecoxib requires a PA. Tried to call pt w/[employer] vietnamese interpreter # [phone]. There was no answer and no VM on H #. 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 They also would not take a message for him to call me back. We will have to call back in the evening or weekend. In order to do PA, we need to know if he has ever tried any NSAID medications for back pain in the past? Examples ibuprofen (Motrin, Advil), Aleve, meloxicam (Mobic), naproxen or other Rx NSAIDS.

## 2016-11-28 ENCOUNTER — Ambulatory Visit (INDEPENDENT_AMBULATORY_CARE_PROVIDER_SITE_OTHER): Payer: BLUE CROSS/BLUE SHIELD | Admitting: Physician Assistant

## 2016-11-28 VITALS — BP 100/66 | HR 65 | Temp 98.3°F | Resp 18 | Ht 62.5 in | Wt 109.0 lb

## 2016-11-28 DIAGNOSIS — Z114 Encounter for screening for human immunodeficiency virus [HIV]: Secondary | ICD-10-CM | POA: Diagnosis not present

## 2016-11-28 DIAGNOSIS — Z13228 Encounter for screening for other metabolic disorders: Secondary | ICD-10-CM | POA: Diagnosis not present

## 2016-11-28 DIAGNOSIS — Z0000 Encounter for general adult medical examination without abnormal findings: Secondary | ICD-10-CM

## 2016-11-28 DIAGNOSIS — Z130 Encounter for screening for diseases of the blood and blood-forming organs and certain disorders involving the immune mechanism: Secondary | ICD-10-CM

## 2016-11-28 DIAGNOSIS — Z8739 Personal history of other diseases of the musculoskeletal system and connective tissue: Secondary | ICD-10-CM

## 2016-11-28 DIAGNOSIS — Z1159 Encounter for screening for other viral diseases: Secondary | ICD-10-CM

## 2016-11-28 DIAGNOSIS — Z1211 Encounter for screening for malignant neoplasm of colon: Secondary | ICD-10-CM | POA: Diagnosis not present

## 2016-11-28 DIAGNOSIS — Z1329 Encounter for screening for other suspected endocrine disorder: Secondary | ICD-10-CM | POA: Diagnosis not present

## 2016-11-28 DIAGNOSIS — Z13220 Encounter for screening for lipoid disorders: Secondary | ICD-10-CM | POA: Diagnosis not present

## 2016-11-28 MED ORDER — NAPROXEN 500 MG PO TABS: 500.0000 mg | ORAL_TABLET | Freq: Two times a day (BID) | ORAL | 0 refills | Status: DC

## 2016-11-28 NOTE — Patient Instructions (Addendum)
Take medication as needed for back pain. Please let us know if you have worsening back pain that is not resolved with medication.   In terms of annual physical exam, follow up in one year.   Thank you for letting me participate in your health and well being.   IF you received an x-ray today, you will [facility]. Please contact [facility] at [phone] with questions or concerns regarding your invoice.   IF you received labwork today, you will receive an invoice from [facility]. Please contact [facility] at [phone] with questions or concerns regarding your invoice.   Our billing staff will not be able to assist you with questions regarding bills from these companies.  You will be contacted with the lab results as soon as they are available. The fastest way to get your results is to activate your My Chart account. Instructions are located on the last page of this paperwork. If you have not heard from us regarding the results in 2 weeks, please contact this office.

## 2016-11-28 NOTE — Progress Notes (Signed)
Antonio Stewart  MRN: [id number] DOB: [birth year]  Subjective:  Pt is a 50 y.o. male who presents for annual physical exam. Video interpreter Tonhi used [phone].    Diet:He eats a lot of vegetables and rice. He likes fruits. He drinks water daily and sweet tea.   Exercise: Does not partake in structured exercise. He walks a lot at work.   Sleep: He gets about 6-7 hours a night. Feels rested throughout the day.   Social: Pt works at a [employer]. He has four children. He is sexually active with monogamous wife.   Of note, pt was seen for low back pain on 11/10/16. Notes this is better now. He is asking for medication for the future in case he has one of these episodes again. States the celebrex is too expensive and he was going to have to pay 400 dollars out of pocket so he did not pick it up.   Last dental exam: 2015  Last vision exam: Never Last colonoscopy: Never  Vaccinations      Tetanus:  10/14/2014       There are no active problems to display for this patient.   Current Outpatient Prescriptions on File Prior to Visit  Medication Sig Dispense Refill  . celecoxib (CELEBREX) 200 MG capsule Take 1 capsule (200 mg total) by mouth 2 (two) times daily. 60 capsule 1   No current facility-administered medications on file prior to visit.     No Known Allergies  Social History   Social History  . Marital status: Married    Spouse name: N/A  . Number of children: N/A  . Years of education: N/A   Social History Main Topics  . Smoking status: Current Some Day Smoker    Packs/day: 0.30    Years: 20.00    Types: Cigarettes  . Smokeless tobacco: Never Used  . Alcohol use No  . Drug use: No  . Sexual activity: No   Other Topics Concern  . None   Social History Narrative  . None    History reviewed. No pertinent surgical history.  History reviewed. No pertinent family history.  Review of Systems  Constitutional: Negative.   HENT: Negative.   Eyes: Negative.     Respiratory: Negative.   Cardiovascular: Negative.   Gastrointestinal: Negative.   Endocrine: Negative.   Genitourinary: Negative.   Musculoskeletal: Positive for back pain (low back pain intermittent, note present today). Negative for arthralgias, gait problem, joint swelling, myalgias, neck pain and neck stiffness.  Skin: Negative.   Allergic/Immunologic: Negative.   Neurological: Negative.   Hematological: Negative.   Psychiatric/Behavioral: Negative.     Objective:  BP 100/66 (BP Location: Right Arm, Patient Position: Sitting, Cuff Size: Small)   Pulse 65   Temp 98.3 F (36.8 C) (Oral)   Resp 18   Ht 5' 2.5" (1.588 m)   Wt 109 lb (49.4 kg)   SpO2 100%   BMI 19.62 kg/m   Physical Exam  Constitutional: He is oriented to person, place, and time and well-developed, well-nourished, and in no distress.  HENT:  Head: Normocephalic and atraumatic.  Right Ear: Hearing, tympanic membrane, external ear and ear canal normal.  Left Ear: Hearing, tympanic membrane, external ear and ear canal normal.  Nose: Nose normal.  Mouth/Throat: Uvula is midline, oropharynx is clear and moist and mucous membranes are normal. No oropharyngeal exudate.  Eyes: Conjunctivae and EOM are normal. Pupils are equal, round, and reactive to light.  Neck: Trachea  normal and normal range of motion.  Cardiovascular: Normal rate, regular rhythm, normal heart sounds and intact distal pulses.   Pulmonary/Chest: Effort normal and breath sounds normal.  Abdominal: Soft. Normal appearance and bowel sounds are normal.  Genitourinary: Rectum normal and prostate normal.  Musculoskeletal: Normal range of motion.  Lymphadenopathy:       Head (right side): No submental, no submandibular, no tonsillar, no preauricular, no posterior auricular and no occipital adenopathy present.       Head (left side): No submental, no submandibular, no tonsillar, no preauricular, no posterior auricular and no occipital adenopathy present.     He has no cervical adenopathy.       Right: No supraclavicular adenopathy present.       Left: No supraclavicular adenopathy present.  Neurological: He is alert and oriented to person, place, and time. He has normal sensation, normal strength and normal reflexes. Gait normal.  Skin: Skin is warm and dry.  Psychiatric: Affect normal.  Vitals reviewed.  No exam data present  Assessment and Plan :  Discussed healthy lifestyle, diet, exercise, preventative care, vaccinations, and addressed patient's concerns. Plan for follow up in one year. Otherwise, plan for specific conditions below.  1. Annual physical exam Await lab results.   2. Screening, anemia, deficiency, iron - CBC with Differential/Platelet  3. Screening for metabolic disorder - ICH79+GVSY  4. Screening, lipid - Lipid panel  5. Screening for thyroid disorder - TSH  6. Screening for HIV (human immunodeficiency virus) - HIV antibody  7. Need for hepatitis C screening test - Hepatitis C antibody  8. Screen for colon cancer - Ambulatory referral to Gastroenterology  9. History of low back pain - naproxen (NAPROSYN) 500 MG tablet; Take 1 tablet (500 mg total) by mouth 2 (two) times daily with a meal.  Dispense: 30 tablet; Refill: 0   Tenna Delaine PA-C  [facility] 11/28/2016 8:25 AM

## 2016-11-29 LAB — CBC WITH DIFFERENTIAL/PLATELET
Basophils Absolute: 0 10*3/uL (ref 0.0–0.2)
Basos: 1 %
EOS (ABSOLUTE): 0.2 10*3/uL (ref 0.0–0.4)
Eos: 4 %
HEMOGLOBIN: 15.9 g/dL (ref 13.0–17.7)
Hematocrit: 46.3 % (ref 37.5–51.0)
IMMATURE GRANS (ABS): 0 10*3/uL (ref 0.0–0.1)
Immature Granulocytes: 0 %
LYMPHS: 40 %
Lymphocytes Absolute: 2.3 10*3/uL (ref 0.7–3.1)
MCH: 30.9 pg (ref 26.6–33.0)
MCHC: 34.3 g/dL (ref 31.5–35.7)
MCV: 90 fL (ref 79–97)
MONOCYTES: 5 %
Monocytes Absolute: 0.3 10*3/uL (ref 0.1–0.9)
NEUTROS ABS: 2.9 10*3/uL (ref 1.4–7.0)
Neutrophils: 50 %
Platelets: 237 10*3/uL (ref 150–379)
RBC: 5.14 x10E6/uL (ref 4.14–5.80)
RDW: 13.9 % (ref 12.3–15.4)
WBC: 5.9 10*3/uL (ref 3.4–10.8)

## 2016-11-29 LAB — LIPID PANEL
CHOLESTEROL TOTAL: 170 mg/dL (ref 100–199)
Chol/HDL Ratio: 3.4 ratio units (ref 0.0–5.0)
HDL: 50 mg/dL (ref >39)
LDL CALC: 106 mg/dL — AB (ref 0–99)
Triglycerides: 68 mg/dL (ref 0–149)
VLDL CHOLESTEROL CAL: 14 mg/dL (ref 5–40)

## 2016-11-29 LAB — CMP14+EGFR
ALT: 40 IU/L (ref 0–44)
AST: 40 IU/L (ref 0–40)
Albumin/Globulin Ratio: 1.5 (ref 1.2–2.2)
Albumin: 4 g/dL (ref 3.5–5.5)
Alkaline Phosphatase: 88 IU/L (ref 39–117)
BUN / CREAT RATIO: 23 — AB (ref 9–20)
BUN: 17 mg/dL (ref 6–24)
Bilirubin Total: 0.4 mg/dL (ref 0.0–1.2)
CO2: 22 mmol/L (ref 18–29)
CREATININE: 0.73 mg/dL — AB (ref 0.76–1.27)
Calcium: 9 mg/dL (ref 8.7–10.2)
Chloride: 103 mmol/L (ref 96–106)
GFR calc non Af Amer: 107 mL/min/{1.73_m2} (ref >59)
GFR, EST AFRICAN AMERICAN: 124 mL/min/{1.73_m2} (ref >59)
Globulin, Total: 2.6 g/dL (ref 1.5–4.5)
Glucose: 97 mg/dL (ref 65–99)
Potassium: 3.9 mmol/L (ref 3.5–5.2)
Sodium: 141 mmol/L (ref 134–144)
Total Protein: 6.6 g/dL (ref 6.0–8.5)

## 2016-11-29 LAB — HEPATITIS C ANTIBODY: Hep C Virus Ab: <0.1 s/co ratio (ref 0.0–0.9)

## 2016-11-29 LAB — TSH: TSH: 1.59 u[IU]/mL (ref 0.450–4.500)

## 2016-11-29 LAB — HIV ANTIBODY (ROUTINE TESTING W REFLEX): HIV Screen 4th Generation wRfx: NONREACTIVE

## 2016-12-10 ENCOUNTER — Telehealth: Payer: Self-pay | Admitting: Emergency Medicine

## 2016-12-10 NOTE — Telephone Encounter (Signed)
-----   Message from Magdalene RiverBrittany D Wiseman, PA-C sent at 11/30/2016 12:51 AM EST ----- Please call pt. He will need [employer] developervietnamese translator. Please inform him his results showed negative HIV and hep c. Also they showed normal blood count, thyroid function, electrolytes, blood sugar, liver enzymes. His creatinine is a little decreased and BUN slightly increased, which is a measure of kidney function. Let's repeat these values in 6 weeks and make sure he continues to drink lots of water and avoid any sweet teas. His bad cholesterol was also slightly elevated. This can be lowered with strict dietary control. Try to lower the amount of saturated fats in your diet, which are found in red meat, butter, cheese, and coconut oil. Replace with monosaturated fats such as olive oil. Thanks!

## 2016-12-13 NOTE — Telephone Encounter (Signed)
Pt came in the following day and got Naproxen

## 2016-12-31 ENCOUNTER — Encounter: Payer: Self-pay | Admitting: *Deleted

## 2017-01-02 ENCOUNTER — Encounter: Payer: Self-pay | Admitting: Physician Assistant

## 2017-01-12 ENCOUNTER — Ambulatory Visit (INDEPENDENT_AMBULATORY_CARE_PROVIDER_SITE_OTHER): Payer: BLUE CROSS/BLUE SHIELD | Admitting: Urgent Care

## 2017-01-12 VITALS — BP 90/66 | HR 79 | Temp 97.5°F | Resp 16 | Ht 65.5 in | Wt 102.6 lb

## 2017-01-12 DIAGNOSIS — R5383 Other fatigue: Secondary | ICD-10-CM | POA: Diagnosis not present

## 2017-01-12 DIAGNOSIS — B349 Viral infection, unspecified: Secondary | ICD-10-CM

## 2017-01-12 DIAGNOSIS — R64 Cachexia: Secondary | ICD-10-CM

## 2017-01-12 DIAGNOSIS — R6889 Other general symptoms and signs: Secondary | ICD-10-CM

## 2017-01-12 MED ORDER — OSELTAMIVIR PHOSPHATE 75 MG PO CAPS: 75.0000 mg | ORAL_CAPSULE | Freq: Two times a day (BID) | ORAL | 0 refills | Status: AC

## 2017-01-12 NOTE — Patient Instructions (Addendum)
Tylenol You may take 524m alternate with ibuprofen 4015mevery 6-8 hours for body aches and fever.    Oseltamivir capsules ?y l thu?c g? OSELTAMIVIR l thu?c khng virus. N ???c dng ?? phng ng?a v ?i?u tr? m?t s? b?nh cm. N khng c tc d?ng ??i v?i b?nh c?m l?nh ho?c cc b?nh nhi?m virus khc. Thu?c ny c th? ???c dng cho nh?ng m?c ?ch khc; hy h?i ng??i cung c?p d?ch v? y t? ho?c d??c s? c?a mnh, n?u qu v? c th?c m?c. (CC) NHN HI?U PH? BI?N: Tamiflu Ti c?n ph?i bo cho ng??i cung c?p d?ch v? y t? c?a mnh ?i?u g tr??c khi dng thu?c ny? H? c?n bi?t li?u qu v? c b?t k? tnh tr?ng no sau ?y khng: -b?nh tim -cc v?n ?? v? h? mi?n d?ch -b?nh th?n -b?nh gan -b?nh ph?i -ph?n ?ng b?t th??ng ho?c d? ?ng v?i oseltamivir -pha?n ??ng b?t th???ng ho??c di? ??ng v??i ca?c d??c ph?m kha?c -pha?n ??ng b?t th???ng ho??c di? ??ng v??i th??c ph?m, thu?c nhu?m, ho??c ch?t ba?o qua?n -?ang c thai ho??c ??nh co? thai -?ang cho con bu? Ti nn s? d?ng thu?c ny nh? th? no? U?ng thu?c ny v?i m?t ly n??c. Hy lm theo cc h??ng d?n trn h?p thu?c ho?c nhn thu?c. B?t ??u dng thu?c ny khi c d?u hi?u ??u tin c?a cc tri?u ch?ng cm. Qu v? c th? u?ng thu?c ny cng ho?c khng cng v?i th?c ?n. N?u thu?c lm kh ch?u bao t? qu v?, th hy u?ng thu?c cng v?i th?c ?n. Dng thu?c ny Deleonardis nh?ng kho?ng th?i gian ??u nhau. Khng ???c dng thu?c ny nhi?u l?n h?n ? ???c ch? d?n. Hy hon t?t ton b? ??t thu?c nh? ? ???c ch? d?n, ngay c? khi qu v? ngh? r?ng tnh tr?ng c?a mnh ? kh h?n. Khng ???c b? qua cc li?u thu?c ho?c ng?ng thu?c ny s?m. Hy bn v?i bc s? nhi khoa c?a qu v? v? vi?c dng thu?c ny ? tr? em. Thu?c ny c th? ???c k toa cho tr? em ch? m?i 14 nga?y tu?i trong nh?ng tr??ng h?p ch?n l?c, nh?ng c?n ph?i th?n tr?ng. Qu li?u: N?u qu v? cho r?ng mnh ? dng qu nhi?u thu?c ny, th hy lin l?c v?i trung tm ki?m sot ch?t ??c ho?c phng c?p c?u ngay l?p  t?c. L?U : Thu?c ny ch? dnh ring cho qu v?. Khng chia s? thu?c ny v?i nh?ng ng??i khc. N?u ti l? qun m?t li?u th sao? N?u l? qun m?t li?u thu?c, hy dng li?u ? ngay khi qu v? s?c nh? ra. N?u h?u nh? ? ??n gi? dng li?u thu?c k? ti?p (trong vng 2 gi? ??ng h?), th hy dng ch? li?u k? ti?p ? m thi. Khng ???c dng li?u g?p ?i ho?c dng thm li?u. Nh?ng g c th? t??ng tc v?i thu?c ny? Cc t??ng tc v?i thu?c khng x?y ra. Danh sch ny c th? khng m t? ?? h?t cc t??ng tc c th? x?y ra. Hy ??a cho ng??i cung c?p d?ch v? y t? c?a mnh danh sch t?t c? cc thu?c, th?o d??c, cc thu?c khng c?n toa, ho?c cc ch? ph?m b? sung m qu v? dng. C?ng nn bo cho h? bi?t r?ng qu v? c ht thu?c, u?ng r??u, ho?c c s? d?ng ma ty tri php hay khng. Vi th? c th? t??ng tc v?i thu?c c?a qu v?. Ti c?n ph?i theo di ?i?u g  trong khi dng thu?c ny? Hy ??n g?p bc s? ho?c [place] vin y t? ?? theo di ??nh k? s?c kh?e c?a mnh. Hy bo cho bc s? ho?c chuyn vin y t?, n?u cc tri?u ch?ng c?a qu v? khng kh h?n, ho?c tr? nn n?ng h?n. N?u qu v? b? cm, qu v? c th? c nguy c? cao b? co gi?t, b? l l?n, ho?c c hnh vi b?t th??ng. ?i?u ny x?y ra s?m trong qu trnh b?nh, v th??ng g?p h?n ? tr? em v thanh thi?u nin. Nh?ng bi?n c? ny khng ph? bi?n, nh?ng c th? d?n ??n th??ng tch do tai n?n cho b?nh nhn. Gia ?nh v ng??i ch?m [place] c?a b?nh nhn nn theo di cc d?u hi?u c?a hnh vi b?t th??ng, v lin l?c ngay v?i bc s? ho?c chuyn vin y t?, n?u b?nh nhn bi?u l? cc d?u hi?u c?a hnh vi b?t th??ng. Ch? ph?m ny khng ph?i l thu?c thay th? cho chch ng?a cm. M?i n?m, hy bn v?i bc s? v? vi?c chch ng?a cm th??ng nin. Ti c th? nh?n th?y nh?ng tc d?ng ph? no khi dng thu?c ny? Nh?ng tc d?ng ph? qu v? c?n ph?i bo cho bc s? ho?c chuyn vin y t? cng s?m cng t?t: -cc ph?n ?ng d? ?ng, ch?ng h?n nh? da b? m?n ??, ng?a, n?i my ?ay, s?ng ? m?t, mi, ho?c l??i -lo  u, l l?n, hnh vi b?t th??ng -kh th? -?o gic, m?t lin h? v?i th?c t?i -m?n ??, r?p da, bong ho?c trc da, bao g?m bn trong mi?ng. -co gi?t (kinh phong) Cc tc d?ng ph? khng c?n ph?i ch?m [place] y t? (hy bo cho bc s? ho?c chuyn vin y t?, n?u cc tc d?ng ph? ny ti?p di?n ho?c gy phi?n toi): -tiu ch?y -?au ??u -bu?n i ho?c i m?a -?au Danh sch ny c th? khng m t? ?? h?t cc tc d?ng ph? c th? x?y ra. Xin g?i t?i bc s? c?a mnh ?? ???c c? v?n chuyn mn v? cc tc d?ng ph?Sander Nephew v? c th? t??ng trnh cc tc d?ng ph? cho FDA theo s? [phone]. Ti nn c?t gi? thu?c c?a mnh ? ?u? ?? ngoi t?m tay tr? em. C?t gi? ? nhi?t ?? phng t? 15 ??n 30 ?? C (57 ??n 84 ?? F). V?t b? t?t c? thu?c ch?a dng sau ngy h?t h?n in trn nhn thu?c ho?c bao thu?c. L?U : ?y l b?n tm t?t. N c th? khng bao hm t?t c? thng tin c th? c. N?u qu v? th?c m?c v? thu?c ny, xin trao ??i v?i bc s?, d??c s?, ho?c ng??i cung c?p d?ch v? y t? c?a mnh.  2017 Elsevier/Gold Standard (2015-08-16 00:00:00)   B?nh cu?m, Ng???i l??n (Influenza, Adult) B?nh cu?m, th???ng hay ????c go?i la? "cu?m" la? m?t b?nh nhi?m vi ru?t chu? y?u a?nh h???ng ??n ????ng h h?p. ????ng h h?p bao g?m ca?c c? quan giu?p quy? vi? th??, ch??ng ha?n nh? ph?i, mu?i va? ho?ng. Cu?m gy ra nhi?u tri?u ch??ng ca?m la?nh ph? bi?n, cu?ng nh? s?t cao va? ?au ng???i. Cu?m d? dng ly t? ng??i sang ng??i (d? ly). Tim pho?ng cu?m (tim v??c xin cu?m) m?i n?m la? ca?ch t?t nh?t ?? pho?ng tra?nh cu?m. NGUYN NHN B?nh cu?m do vi rt gy ra. Quy? vi? co? th? nhi?m vi ru?t b??ng ca?ch:  Ht ph?i b?t b?n ra khi ng??i b? nhi?m b?nh ho ho?c h?t  h?i.  Ch?m [place] nh?ng v?t ? b? nhi?m vi rt g?n ?y, sau ? ch?m [place] mi?ng, m?i ho?c m?t mnh. CC Y?U T? NGUY C? Nh?ng y?u t? sau c th? lm qu v? d? b? cu?m h?n:  Khng r??a tay th???ng xuyn b??ng xa? pho?ng va? n???c ho??c thu?c sa?t tru?ng tay co?  c?n.  Ti?p xu?c g?n gu?i v??i nhi?u ng???i trong mu?a la?nh va? ca?m cu?m.  Cha?m va?o mi?ng, m??t ho??c mu?i ma? khng r??a ho??c sa?t tru?ng tay tr???c.  Khng u?ng ?u? n???c ho??c khng ?n m?t ch? ?? ?n co? l??i cho s??c kho?e.  Khng ngu? ho??c t?p th? du?c ?u?Marland Kitchen  Bi? c?ng th??ng r?t nhi?u.  Khng tim pho?ng cu?m m?i n?m (ha?ng n?m). Quy? vi? co? th? co? nguy c? b? bi?n ch??ng cu?m cao h?n, ch??ng ha?n nh? nhi?m trng ph?i n??ng (vim ph?i), n?u quy? vi?:  Tu?i trn 65.  Co? [place].  Co? h? th?ng pho?ng ch?ng b?nh t?t (h? mi?n di?ch) y?u. Quy? vi? c h? th?ng mi?n d?ch b? suy y?u n?u quy? vi?:  B? nhi?m HIV ho?c AIDS.  ?ang ????c ho?a tri?.  ?ang du?ng ca?c loa?i thu?c ma? la?m gia?m hoa?t ??ng (la?m suy y?u) h? mi?n di?ch.  Co? b?nh ko da?i (ma?n ti?nh), ch??ng ha?n b?nh tim, b?nh th?n, ti?u ????ng ho??c b?nh ph?i.  Bi? m?t b?nh l ? gan.  Bo ph.  Bi? thi?u ma?u. TRI?U CH?NG Ca?c tri?u ch??ng cu?a tnh tr?ng na?y th???ng ke?o da?i 4-10 nga?y va? co? th? bao g?m:  S?t.  ?n l?nh.  ?au ??u, ?au nh?c c? th? ho??c ?au nh?c c? b?p.  ?au h?ng.  Ho.  Ch?y n??c m?i ho?c ng?t m?i.  C?m gic kh ch?u ? ng?c v ho.  ?n khng ngon.  Y?u ho??c m?t m?i (m?t).  Chng m?t.  Bu?n nn ho?c nn. CH?N ?ON Tnh tr?ng ny c th? ???c ch?n ?on d?a [place] khai thc b?nh s? cu?a quy? vi? v khm th?c th?Paulino Rily gia ch?m so?c s??c kho?e cu?a quy? vi? co? th? la?m xe?t nghi?m b?ng cch dng t?m bng ngoy m?i ho?c ngoy h?ng ?? xa?c ??nh ch?n ?on. ?I?U TR? N?u cm ???c pha?t hi?n s??m, quy? vi? co? th? ???c ?i?u tri? b??ng thu?c kha?ng vi ru?t, thu?c co? th? la?m gia?m th??i gian bi? b?nh va? gia?m m??c ?? n?ng cu?a ca?c tri?u ch??ng. Thu?c na?y co? th? ???c cho du?ng theo ???ng mi?ng (????ng u?ng) ho??c qua m?t ?ng theo ???ng t?nh m?ch (IV) ??t va?o m?t trong ca?c ti?nh ma?ch cu?a quy? vi?. Mu?c tiu cu?a ?i?u  tri? la? la?m gia?m tri?u ch??ng b??ng ca?ch qu v? t?? ch?m so?c t?i nha?Marland Kitchen Vi?c na?y co? th? bao g?m du?ng ca?c loa?i thu?c khng c?n k ??n, u?ng nhi?u n??c va? t?ng ?? ?m cho khng khi? trong nha? quy? vi?. Trong m?t s? tr??ng h?p, b?nh cm s? t? kh?i. B?nh cu?m n??ng ho??c cc bi?n ch??ng c?a b?nh cu?m co? th? c?n ????c ?i?u tri? trong b?nh vi?n. H??NG D?N CH?M [place] T?I NH  Ch? s? d?ng thu?c khng c?n k ??n v thu?c c?n k ??n theo ch? d?n c?a chuyn gia ch?m [place] s?c kh?e.  S?? du?ng m?t ma?y ta?o s??ng mu? ma?t ?? lm t?ng ?? ?m cho khng khi? trong nha? quy? vi?. Vi?c na?y co? th? la?m quy? vi? th?? d? h?n.  Ngh? ng?i khi c?n.  U?ng ?? n??c ?? gi? cho n??c ti?u trong ho?c c mu vng nh?t.  Che mi?ng v m?i khi qu v? ho ho?c h?t h?i.  Th??ng xuyn r??a tay quy? vi? b??ng xa? pho?ng va? n???c, nh?t l sau khi quy? vi? ho ho??c h??t h?i. N?u khng c x phng v n??c, hy dng thu?c st trng tay.  Nghi? la?m ho??c nghi? ho?c theo chi? d?n cu?a chuyn gia ch?m so?c s??c kho?e. Tr?? khi quy? vi? ?ang ??n g?p chuyn gia ch?m so?c s??c kho?e, ha?y c? g??ng khng r??i nha? cho ??n khi h?t s?t trong 24 gi?? sau khi khng du?ng thu?c.  Tun th? t?t c? cc cu?c h?n khm l?i theo ch? d?n c?a chuyn gia ch?m [place] s?c kh?e. ?i?u ny c vai tr quan tr?ng. PHNG NG?A  Tim pho?ng cu?m ha?ng n?m la? ca?ch t?t nh?t ?? pho?ng tra?nh cu?m. Quy? vi? co? th? ????c tim pho?ng cu?m va?o cu?i mu?a he?, mu?a thu ho??c mu?a ?ng. Hy h?i chuyn gia ch?m [place] s?c kh?e ?? bi?t khi no qu v? c?n tim pho?ng cu?m.  R??a tay th???ng xuyn ho??c s?? du?ng thu?c sa?t tru?ng tay th???ng xuyn.  Tra?nh ti?p xu?c v??i nh??ng ng???i ?ang bi? ?m trong mu?a la?nh va? ma cu?m.  ?n m?t ch? ?? ?n co? l??i cho s??c kho?e, u?ng nhi?u n???c, ngu? ?u? va? t?p th? du?c th???ng xuyn. ?I KHM N?U:  Qu v? c cc tri?u ch?ng m?i.  Qu v? bi?:  ?au ng?c.  Tiu  ch?y.  S?t.  Qu v? ho tr?m tr?ng h?n.  Quy? vi? ho ra nhi?u di?ch nh?y h?n.  Qu v? c?m th?y bu?n nn ho?c qu v? nn. NGAY L?P T?C ?I KHM N?U:  Quy? vi? bi? th? d?c ho?c kh th?.  Da ho??c cc mng c?a quy? vi? chuy?n sang ma?u h?i xanh.  Qu v? b? ?au c? ho?c c?ng c? d? d?i.  Qu v? ??t nhin b? ?au ??u, ho?c ??t nhin bi? ?au ? m?t ho?c ? tai.  Quy? vi? khng th? ng?ng nn m?a. Thng tin ny khng nh?m m?c ?ch thay th? cho l?i khuyn m chuyn gia ch?m [place] s?c kh?e ni v?i qu v?. Hy b?o ??m qu v? ph?i th?o lu?n b?t k? v?n ?? g m qu v? c v?i chuyn gia ch?m  s?c kh?e c?a qu v?. Document Released: 11/24/2005 Document Revised: 03/17/2016 Document Reviewed: 09/18/2015 Elsevier Interactive Patient Education  2017 [place].    IF you received an x-ray today, you will receive an invoice from [facility]. Please contact [facility] at [phone] with questions or concerns regarding your invoice.   IF you received labwork today, you will receive an invoice from [place]. Please contact [facility] at [phone] with questions or concerns regarding your invoice.   Our billing staff will not be able to assist you with questions regarding bills from these companies.  You will be contacted with the lab results as soon as they are available. The fastest way to get your results is to activate your My Chart account. Instructions are located on the last page of this paperwork. If you have not heard from [place] regarding the results in 2 weeks, please contact this office.

## 2017-01-12 NOTE — Progress Notes (Signed)
  MRN: [id number] DOB: 01/01/1966  Subjective:   Antonio Stewart is a 51 y.o. male presenting for chief complaint of Chills (Sxs started yesterday) and Fatigue  Reports 2 day history of subjective fever, body aches, chills, fatigue, malaise, mild cough. Denies headache, sore throat, chest pain, shob, wheezing, n/v, abdominal pain, rashes. Denies night sweats, decreased appetite, weakness. Patient eats regular meals. Smokes ~1 pack in 1 week. His last annual exam was with PA-Wiseman and was recommended to have a repeat of some labs now.   Antonio Stewart is not currently taking any medications.  Also has No Known Allergies.  Antonio Stewart denies past medical history and past surgical history.   Objective:   Vitals: BP 90/66 (BP Location: Right Arm, Patient Position: Sitting, Cuff Size: Normal)   Pulse 79   Temp 97.5 F (36.4 C) (Oral)   Resp 16   Ht 5' 5.5" (1.664 m)   Wt 102 lb 9.6 oz (46.5 kg)   SpO2 100%   BMI 16.81 kg/m   Wt Readings from Last 3 Encounters:  01/12/17 102 lb 9.6 oz (46.5 kg)  11/28/16 109 lb (49.4 kg)  11/11/16 111 lb (50.3 kg)   Physical Exam  Constitutional: He is oriented to person, place, and time. He appears well-developed and well-nourished.  HENT:  TM's intact bilaterally, no effusions or erythema. Nasal turbinates pink and moist, nasal passages patent. No sinus tenderness. Oropharynx clear, mucous membranes moist, dentition in good repair.  Eyes: Pupils are equal, round, and reactive to light. Right eye exhibits no discharge. Left eye exhibits no discharge.  Neck: Normal range of motion. Neck supple.  Cardiovascular: Normal rate, regular rhythm and intact distal pulses.  Exam reveals no gallop and no friction rub.   No murmur heard. Pulmonary/Chest: No respiratory distress. He has no wheezes. He has no rales.  Lymphadenopathy:    He has no cervical adenopathy.  Neurological: He is alert and oriented to person, place, and time.  Skin: Skin is warm and dry.   Assessment and  Plan :   1. Viral syndrome 2. Flu-like symptoms - Will manage for the flu. Supportive care recommended. RTC if no improvement in 1 week.  3. Cachectic (HCC) 4. Other fatigue - Labs pending. Will discuss case with PA-Wiseman.  Wallis BambergMario Jackee Glasner, PA-C [facility] [phone] 01/12/2017  11:52 AM

## 2017-01-13 LAB — CBC WITH DIFFERENTIAL/PLATELET
Basophils Absolute: 0 10*3/uL (ref 0.0–0.2)
Basos: 0 %
EOS (ABSOLUTE): 0.1 10*3/uL (ref 0.0–0.4)
Eos: 1 %
Hematocrit: 48.5 % (ref 37.5–51.0)
Hemoglobin: 16.4 g/dL (ref 13.0–17.7)
IMMATURE GRANS (ABS): 0 10*3/uL (ref 0.0–0.1)
IMMATURE GRANULOCYTES: 0 %
LYMPHS: 33 %
Lymphocytes Absolute: 1.9 10*3/uL (ref 0.7–3.1)
MCH: 31.1 pg (ref 26.6–33.0)
MCHC: 33.8 g/dL (ref 31.5–35.7)
MCV: 92 fL (ref 79–97)
Monocytes Absolute: 0.6 10*3/uL (ref 0.1–0.9)
Monocytes: 10 %
NEUTROS PCT: 56 %
Neutrophils Absolute: 3.2 10*3/uL (ref 1.4–7.0)
PLATELETS: 221 10*3/uL (ref 150–379)
RBC: 5.28 x10E6/uL (ref 4.14–5.80)
RDW: 14.6 % (ref 12.3–15.4)
WBC: 5.8 10*3/uL (ref 3.4–10.8)

## 2017-01-13 LAB — BASIC METABOLIC PANEL
BUN/Creatinine Ratio: 24 — ABNORMAL HIGH (ref 9–20)
BUN: 17 mg/dL (ref 6–24)
CALCIUM: 8.8 mg/dL (ref 8.7–10.2)
CHLORIDE: 99 mmol/L (ref 96–106)
CO2: 22 mmol/L (ref 18–29)
Creatinine, Ser: 0.7 mg/dL — ABNORMAL LOW (ref 0.76–1.27)
GFR calc non Af Amer: 109 mL/min/{1.73_m2} (ref >59)
GFR, EST AFRICAN AMERICAN: 125 mL/min/{1.73_m2} (ref >59)
Glucose: 115 mg/dL — ABNORMAL HIGH (ref 65–99)
POTASSIUM: 4.7 mmol/L (ref 3.5–5.2)
Sodium: 140 mmol/L (ref 134–144)

## 2017-01-13 LAB — TSH: TSH: 1.72 u[IU]/mL (ref 0.450–4.500)

## 2017-01-14 ENCOUNTER — Encounter: Payer: Self-pay | Admitting: Urgent Care

## 2017-01-19 ENCOUNTER — Ambulatory Visit: Payer: BLUE CROSS/BLUE SHIELD

## 2018-10-25 DIAGNOSIS — R3121 Asymptomatic microscopic hematuria: Secondary | ICD-10-CM | POA: Diagnosis not present

## 2018-10-25 DIAGNOSIS — M545 Low back pain: Secondary | ICD-10-CM | POA: Diagnosis not present

## 2018-10-25 DIAGNOSIS — S339XXA Sprain of unspecified parts of lumbar spine and pelvis, initial encounter: Secondary | ICD-10-CM | POA: Diagnosis not present
